# Patient Record
Sex: Male | Born: 1995 | Race: White | Hispanic: No | Marital: Single | Smoking: Never smoker
Health system: Southern US, Community
[De-identification: ages and names within clinical notes are randomized; demographics above are authoritative.]

## PROBLEM LIST (undated history)

## (undated) DIAGNOSIS — Z872 Personal history of diseases of the skin and subcutaneous tissue: Secondary | ICD-10-CM

## (undated) DIAGNOSIS — Z889 Allergy status to unspecified drugs, medicaments and biological substances status: Secondary | ICD-10-CM

## (undated) DIAGNOSIS — Z87898 Personal history of other specified conditions: Secondary | ICD-10-CM

## (undated) HISTORY — PX: EYE SURGERY: SHX253

## (undated) HISTORY — DX: Allergy status to unspecified drugs, medicaments and biological substances: Z88.9

## (undated) HISTORY — DX: Personal history of diseases of the skin and subcutaneous tissue: Z87.2

## (undated) HISTORY — DX: Personal history of other specified conditions: Z87.898

---
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
## 2017-09-07 ENCOUNTER — Emergency Department (HOSPITAL_COMMUNITY): Payer: Self-pay

## 2017-09-07 ENCOUNTER — Emergency Department (HOSPITAL_COMMUNITY)
Admission: EM | Admit: 2017-09-07 | Discharge: 2017-09-07 | Disposition: A | Discharge status: Home / Self Care | Payer: Self-pay | Attending: Emergency Medicine | Admitting: Emergency Medicine

## 2017-09-07 ENCOUNTER — Encounter (HOSPITAL_COMMUNITY): Payer: Self-pay | Admitting: Emergency Medicine

## 2017-09-07 DIAGNOSIS — R519 Headache, unspecified: Secondary | ICD-10-CM

## 2017-09-07 DIAGNOSIS — R51 Headache: Secondary | ICD-10-CM | POA: Insufficient documentation

## 2017-09-07 LAB — CBC WITH DIFFERENTIAL/PLATELET
Basophils Absolute: 0 10*3/uL (ref 0.0–0.1)
Basophils Relative: 0 %
Eosinophils Absolute: 0.2 10*3/uL (ref 0.0–0.7)
Eosinophils Relative: 2 %
HCT: 53.3 % — ABNORMAL HIGH (ref 39.0–52.0)
Hemoglobin: 19.2 g/dL — ABNORMAL HIGH (ref 13.0–17.0)
Lymphocytes Relative: 46 %
Lymphs Abs: 4.2 10*3/uL — ABNORMAL HIGH (ref 0.7–4.0)
MCH: 33 pg (ref 26.0–34.0)
MCHC: 36 g/dL (ref 30.0–36.0)
MCV: 91.7 fL (ref 78.0–100.0)
Monocytes Absolute: 0.7 10*3/uL (ref 0.1–1.0)
Monocytes Relative: 7 %
Neutro Abs: 4.2 10*3/uL (ref 1.7–7.7)
Neutrophils Relative %: 45 %
Platelets: 214 10*3/uL (ref 150–400)
RBC: 5.81 MIL/uL (ref 4.22–5.81)
RDW: 12.3 % (ref 11.5–15.5)
WBC: 9.4 10*3/uL (ref 4.0–10.5)

## 2017-09-07 LAB — BASIC METABOLIC PANEL
Anion gap: 7 (ref 5–15)
BUN: 10 mg/dL (ref 6–20)
CO2: 29 mmol/L (ref 22–32)
Calcium: 9.9 mg/dL (ref 8.9–10.3)
Chloride: 104 mmol/L (ref 101–111)
Creatinine, Ser: 0.89 mg/dL (ref 0.61–1.24)
GFR calc Af Amer: >60 mL/min (ref >60)
GFR calc non Af Amer: >60 mL/min (ref >60)
Glucose, Bld: 74 mg/dL (ref 65–99)
Potassium: 3.7 mmol/L (ref 3.5–5.1)
Sodium: 140 mmol/L (ref 135–145)

## 2017-09-07 MED ORDER — SODIUM CHLORIDE 0.9 % IV SOLN
25.0000 mg | Freq: Once | INTRAVENOUS | Status: AC
  Administered 2017-09-07: 25 mg via INTRAVENOUS
  Filled 2017-09-07: qty 0.5

## 2017-09-07 MED ORDER — KETOROLAC TROMETHAMINE 15 MG/ML IJ SOLN
15.0000 mg | Freq: Once | INTRAMUSCULAR | Status: AC
  Administered 2017-09-07: 15 mg via INTRAVENOUS
  Filled 2017-09-07: qty 1

## 2017-09-07 MED ORDER — IOPAMIDOL (ISOVUE-370) INJECTION 76%
INTRAVENOUS | Status: AC
  Administered 2017-09-07: 75 mL via INTRAVENOUS
  Filled 2017-09-07: qty 100

## 2017-09-07 MED ORDER — SODIUM CHLORIDE 0.9 % IV BOLUS (SEPSIS)
1000.0000 mL | Freq: Once | INTRAVENOUS | Status: AC
  Administered 2017-09-07: 1000 mL via INTRAVENOUS

## 2017-09-07 MED ORDER — IOPAMIDOL (ISOVUE-370) INJECTION 76%
75.0000 mL | Freq: Once | INTRAVENOUS | Status: AC | PRN
  Administered 2017-09-07 (×2): 75 mL via INTRAVENOUS

## 2017-09-07 MED ORDER — PROCHLORPERAZINE EDISYLATE 5 MG/ML IJ SOLN
10.0000 mg | Freq: Once | INTRAMUSCULAR | Status: AC
  Administered 2017-09-07: 10 mg via INTRAVENOUS
  Filled 2017-09-07: qty 2

## 2017-09-07 NOTE — ED Notes (Signed)
Pt. States he has took tylenol and his friends migraine medicine.

## 2017-09-07 NOTE — ED Triage Notes (Signed)
Patient c/o headache with N/V and light sensitivity x4 days. Reports minimal neck pain. Hx migraines. Reports he was sent by [facility] to r/o meningitis.

## 2017-09-07 NOTE — ED Notes (Signed)
MD at bedside. 

## 2017-09-10 NOTE — ED Provider Notes (Signed)
[facility]   CSN: [id number] Arrival date & time: 09/07/17  1159     History   Chief Complaint Chief Complaint  Patient presents with  . Headache    HPI Chris Browning is a 21 y.o. male.  HPI  21 year old male with headache. Acute onset of severe pain from partially 4 days ago. He still rates his pain as severe although actually somewhat improved from when initially started. Mild nausea. No vomiting. Photophobia. Headache is diffuse but worse in the occipital region. Denies any neck pain. No neck stiffness. No fevers. No acute numbness, tingling or focal loss of strength. No blood thinners. Denies trauma.  Past Medical History:  Diagnosis Date  . History of acne   . History of headache   . History of seasonal allergies     There are no active problems to display for this patient.   Past Surgical History:  Procedure Laterality Date  . EYE SURGERY     at 21 years old       Home Medications    Prior to Admission medications   Medication Sig Start Date End Date Taking? Authorizing Provider  acetaminophen (TYLENOL) 500 MG tablet Take 1,500 mg by mouth every 6 (six) hours as needed for headache.   Yes [provider]  acetaminophen-codeine (TYLENOL #3) 300-30 MG per tablet Take 1 tablet by mouth every 8 (eight) hours as needed for moderate pain or severe pain. Patient not taking: Reported on 09/07/2017 01/01/14   Lenell Antu, DO    Family History Family History  Problem Relation Age of Onset  . Alcoholism Unknown        both set of grandparents  . Breast cancer Paternal Grandmother   . Seizures Paternal Grandmother   . Diabetes Maternal Grandmother   . Cervical cancer Mother   . Hypertension Unknown        both sets of grandparents  . Heart disease Unknown        both sets of grandparents  . High Cholesterol Unknown        both sets of grandparents    Social History Social History  Substance Use  Topics  . Smoking status: Never Smoker  . Smokeless tobacco: Not on file  . Alcohol use No     Allergies   Patient has no known allergies.   Review of Systems Review of Systems  All systems reviewed and negative, other than as noted in HPI.  Physical Exam Updated Vital Signs BP 136/79 (BP Location: Left Arm)   Pulse 90   Temp 98.2 F (36.8 C) (Oral)   Resp 14   SpO2 99%   Physical Exam  Constitutional: He is oriented to person, place, and time. He appears well-developed and well-nourished. No distress.  HENT:  Head: Normocephalic and atraumatic.  Eyes: Conjunctivae are normal. Right eye exhibits no discharge. Left eye exhibits no discharge.  Neck: Neck supple.  Nuchal rigidity. Negative Kernig/Brudzinski signs.  Cardiovascular: Normal rate, regular rhythm and normal heart sounds.  Exam reveals no gallop and no friction rub.   No murmur heard. Pulmonary/Chest: Effort normal and breath sounds normal. No respiratory distress.  Abdominal: Soft. He exhibits no distension. There is no tenderness.  Musculoskeletal: He exhibits no edema or tenderness.  Neurological: He is alert and oriented to person, place, and time. No cranial nerve deficit or sensory deficit. Coordination normal.  Skin: Skin is warm and dry. He is not diaphoretic.  Psychiatric:  He has a normal mood and affect. His behavior is normal. Thought content normal.  Nursing note and vitals reviewed.    ED Treatments / Results  Labs (all labs ordered are listed, but only abnormal results are displayed) Labs Reviewed  CBC WITH DIFFERENTIAL/PLATELET - Abnormal; Notable for the following:       Result Value   Hemoglobin 19.2 (*)    HCT 53.3 (*)    Lymphs Abs 4.2 (*)    All other components within normal limits  BASIC METABOLIC PANEL    EKG  EKG Interpretation None       Radiology No results found.   Ct Angio Head W Or Wo Contrast  Result Date: 09/07/2017 CLINICAL DATA:  21 year old male with  headache, nausea vomiting, photophobia for 4 days. EXAM: CT ANGIOGRAPHY HEAD TECHNIQUE: Multidetector CT imaging of the head was performed using the standard protocol during bolus administration of intravenous contrast. Multiplanar CT image reconstructions and MIPs were obtained to evaluate the vascular anatomy. CONTRAST:  75 mL Isovue 378 COMPARISON:  None. FINDINGS: CT HEAD Brain: Cerebral volume is normal. No midline shift, ventriculomegaly, mass effect, evidence of mass lesion, intracranial hemorrhage or evidence of cortically based acute infarction. Gray-white matter differentiation is within normal limits throughout the brain. Calvarium and skull base: Negative. Paranasal sinuses: Clear. Orbits: Visualized orbits and scalp soft tissues are within normal limits. CTA HEAD Posterior circulation: Dominant distal left vertebral artery is patent without stenosis. Non dominant distal right vertebral artery is patent to the vertebrobasilar junction. Patent PICA origins. Patent basilar artery without stenosis. Normal SCA and PCA origins. Right posterior communicating artery is present while the left is diminutive or absent. Normal bilateral PCA branches. Anterior circulation: Normal visible cervical ICAs. The right ICA siphon appears dominant. No siphon atherosclerosis or stenosis identified. Normal ophthalmic and right posterior communicating artery origins. Patent carotid termini. The right ACA A1 segment is dominant and the left is diminutive or absent. The anterior communicating artery is ectatic with a median artery of the corpus callosum, but otherwise normal. The median artery appears dominant, while the bilateral A2 segments and branches are non dominant. Normal MCA origins. Early left MCA branching. Left MCA bifurcation and branches are within normal limits. Right MCA M1 segment, bifurcation, and right MCA branches are within normal limits. Venous sinuses: Patent. Anatomic variants: Dominant right ICA siphon,  diminutive or absent left ACA A1 segment, and dominant median artery of the corpus callosum. Dominant distal left vertebral artery. Delayed phase: No abnormal enhancement identified. Review of the MIP images confirms the above findings IMPRESSION: 1. Negative CTA Head; normal anatomic variations of the anterior cerebral artery anatomy. 2.  Normal CT appearance of the brain. Electronically Signed   By: Odessa FlemingH  Hall M.D.   On: 09/07/2017 16:06    Procedures Procedures (including critical care time)  Medications Ordered in ED Medications  sodium chloride 0.9 % bolus 1,000 mL (0 mLs Intravenous Stopped 09/07/17 1544)  prochlorperazine (COMPAZINE) injection 10 mg (10 mg Intravenous Given 09/07/17 1438)  diphenhydrAMINE (BENADRYL) 25 mg in sodium chloride 0.9 % 50 mL IVPB (0 mg Intravenous Stopped 09/07/17 1518)  iopamidol (ISOVUE-370) 76 % injection 75 mL (75 mLs Intravenous Contrast Given 09/07/17 1623)  ketorolac (TORADOL) 15 MG/ML injection 15 mg (15 mg Intravenous Given 09/07/17 1710)     Initial Impression / Assessment and Plan / ED Course  I have reviewed the triage vital signs and the nursing notes.  Pertinent labs & imaging results that were available  during my care of the patient were reviewed by me and considered in my medical decision making (see chart for details).     21 year old male with headache. Refer to ED for evaluation possible meningitis. Clinically, I highly doubt this. He is afebrile. Symptoms have been ongoing for 4 days and generally appears very well. He is afebrile. If anything, I'm somewhat concerned for possible subarachnoid hemorrhage given the very abrupt onset, different from prior headaches and certainly worse than prior headaches. He had a CTA which is unremarkable. Headache improved after medications. I doubt emergent process.   Final Clinical Impressions(s) / ED Diagnoses   Final diagnoses:  Acute nonintractable headache, unspecified headache type    New  Prescriptions Discharge Medication List as of 09/07/2017  4:24 PM       Raeford Razor, MD 09/10/17 1144

## 2018-11-30 IMAGING — CT CT ANGIO HEAD
1 of 11 series · 5 of 35 positions shown · IV contrast (ISOVUE 370)
Comparison: None.

CLINICAL DATA: 21-year-old male with headache, nausea vomiting,
photophobia for 4 days.

EXAM:
CT ANGIOGRAPHY HEAD
TECHNIQUE: Multidetector CT imaging of the head was performed using the
standard protocol during bolus administration of intravenous
contrast. Multiplanar CT image reconstructions and MIPs were
obtained to evaluate the vascular anatomy.
CONTRAST:  75 mL Isovue 378

[Series 10: axial thin · axial · 0.33mm/px · z∈[-38,+61]mm · 5 of 150 slices shown]
[im 25/150  soft-tissue]
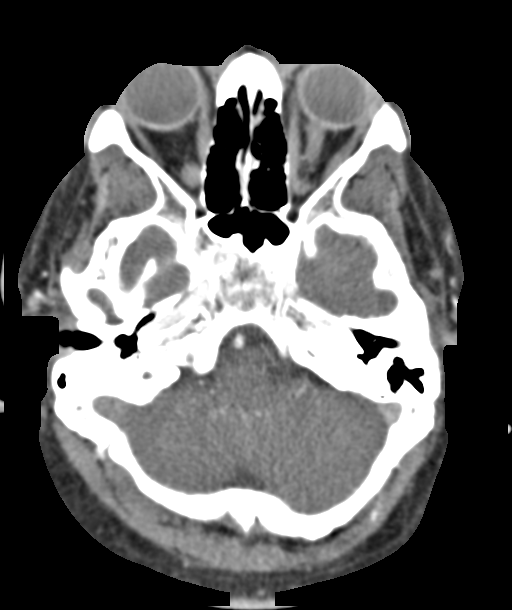
[im 50/150  bone]
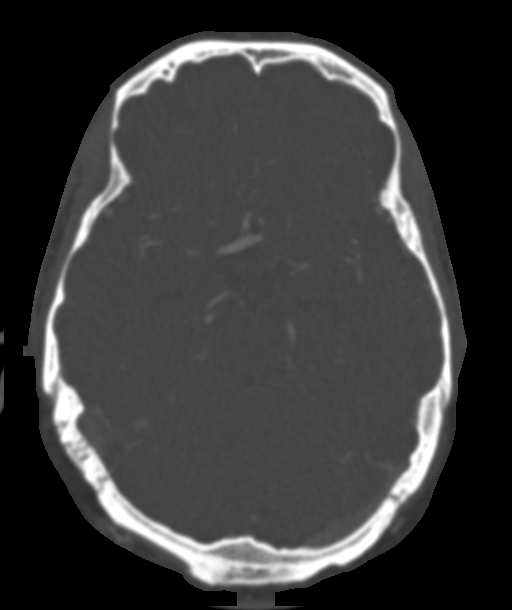
[im 75/150  soft-tissue]
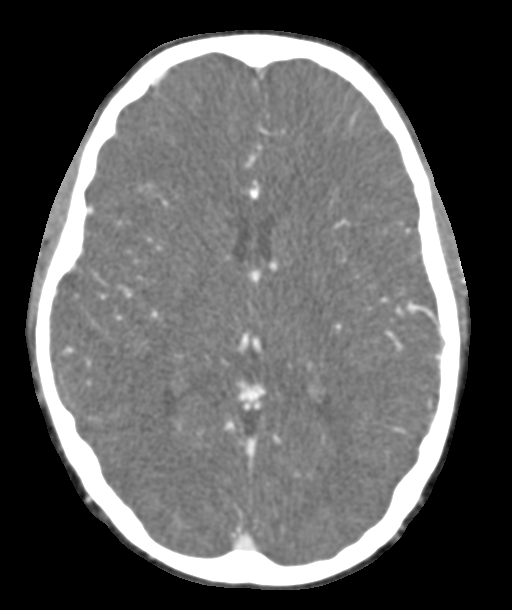
[im 100/150  bone]
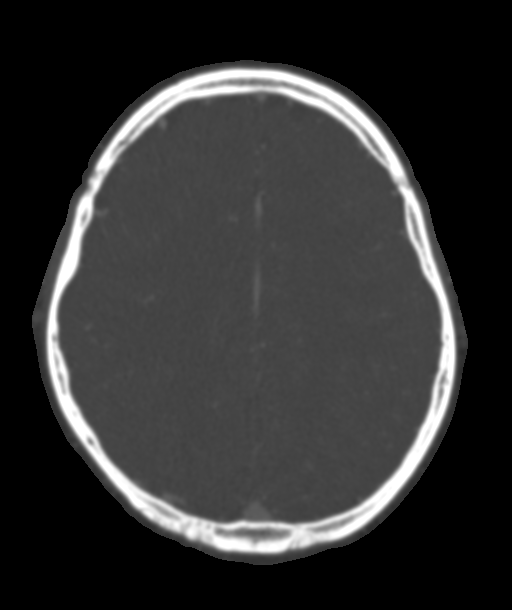
[im 125/150  soft-tissue]
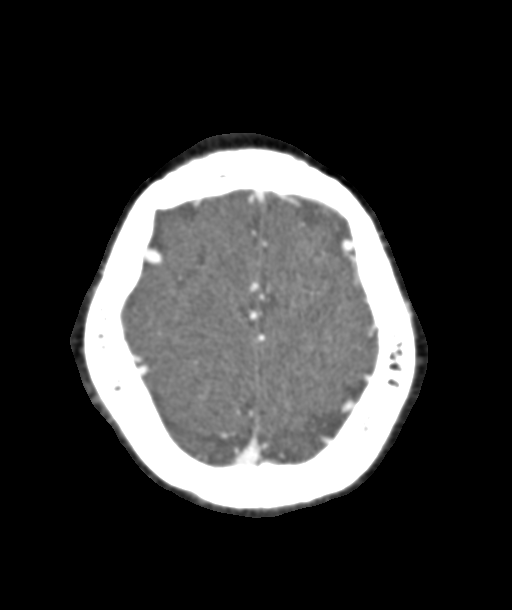

[5 of 35 positions shown; findings below may reference images not displayed]

FINDINGS: CT HEAD

Brain: Cerebral volume is normal. No midline shift,
ventriculomegaly, mass effect, evidence of mass lesion, intracranial
hemorrhage or evidence of cortically based acute infarction.
Gray-white matter differentiation is within normal limits throughout
the brain.

Calvarium and skull base: Negative.

Paranasal sinuses: Clear.

Orbits: Visualized orbits and scalp soft tissues are within normal
limits.

CTA HEAD

Posterior circulation: Dominant distal left vertebral artery is
patent without stenosis. Non dominant distal right vertebral artery
is patent to the vertebrobasilar junction. Patent PICA origins.
Patent basilar artery without stenosis. Normal SCA and PCA origins.
Right posterior communicating artery is present while the left is
diminutive or absent. Normal bilateral PCA branches.

Anterior circulation: Normal visible cervical ICAs. The right ICA
siphon appears dominant. No siphon atherosclerosis or stenosis
identified. Normal ophthalmic and right posterior communicating
artery origins. Patent carotid termini.

The right ACA A1 segment is dominant and the left is diminutive or
absent. The anterior communicating artery is ectatic with a median
artery of the corpus callosum, but otherwise normal. The median
artery appears dominant, while the bilateral A2 segments and
branches are non dominant.

Normal MCA origins. Early left MCA branching. Left MCA bifurcation
and branches are within normal limits. Right MCA M1 segment,
bifurcation, and right MCA branches are within normal limits.

Venous sinuses: Patent.

Anatomic variants: Dominant right ICA siphon, diminutive or absent
left ACA A1 segment, and dominant median artery of the corpus
callosum. Dominant distal left vertebral artery.

Delayed phase: No abnormal enhancement identified.

Review of the MIP images confirms the above findings
IMPRESSION: 1. Negative CTA Head; normal anatomic variations of the anterior
cerebral artery anatomy.
2.  Normal CT appearance of the brain.

## 2019-10-01 ENCOUNTER — Other Ambulatory Visit: Payer: Self-pay

## 2019-10-01 DIAGNOSIS — Z20822 Contact with and (suspected) exposure to covid-19: Secondary | ICD-10-CM

## 2019-10-02 LAB — NOVEL CORONAVIRUS, NAA: SARS-CoV-2, NAA: DETECTED — AB

## 2019-10-08 ENCOUNTER — Other Ambulatory Visit: Payer: Self-pay

## 2019-10-08 DIAGNOSIS — Z20822 Contact with and (suspected) exposure to covid-19: Secondary | ICD-10-CM

## 2019-10-11 LAB — NOVEL CORONAVIRUS, NAA: SARS-CoV-2, NAA: DETECTED — AB

## 2019-10-12 ENCOUNTER — Ambulatory Visit: Payer: Self-pay

## 2019-10-12 NOTE — Telephone Encounter (Signed)
Provided covid lab results to Patient voiced understanding.  Provided care advice.

## 2019-10-18 ENCOUNTER — Other Ambulatory Visit: Payer: Self-pay

## 2019-10-18 DIAGNOSIS — Z20822 Contact with and (suspected) exposure to covid-19: Secondary | ICD-10-CM

## 2019-10-20 LAB — NOVEL CORONAVIRUS, NAA: SARS-CoV-2, NAA: NOT DETECTED

## 2020-11-22 ENCOUNTER — Emergency Department (HOSPITAL_COMMUNITY): Payer: Self-pay

## 2020-11-22 ENCOUNTER — Emergency Department (HOSPITAL_COMMUNITY)
Admission: EM | Admit: 2020-11-22 | Discharge: 2020-11-23 | Disposition: A | Discharge status: Home / Self Care | Payer: Self-pay | Attending: Emergency Medicine | Admitting: Emergency Medicine

## 2020-11-22 ENCOUNTER — Encounter (HOSPITAL_COMMUNITY): Payer: Self-pay | Admitting: Emergency Medicine

## 2020-11-22 ENCOUNTER — Other Ambulatory Visit: Payer: Self-pay

## 2020-11-22 DIAGNOSIS — R509 Fever, unspecified: Secondary | ICD-10-CM | POA: Insufficient documentation

## 2020-11-22 DIAGNOSIS — R519 Headache, unspecified: Secondary | ICD-10-CM | POA: Insufficient documentation

## 2020-11-22 DIAGNOSIS — Z5321 Procedure and treatment not carried out due to patient leaving prior to being seen by health care provider: Secondary | ICD-10-CM | POA: Insufficient documentation

## 2020-11-22 DIAGNOSIS — Z20822 Contact with and (suspected) exposure to covid-19: Secondary | ICD-10-CM | POA: Insufficient documentation

## 2020-11-22 DIAGNOSIS — J029 Acute pharyngitis, unspecified: Secondary | ICD-10-CM | POA: Insufficient documentation

## 2020-11-22 DIAGNOSIS — R059 Cough, unspecified: Secondary | ICD-10-CM | POA: Insufficient documentation

## 2020-11-22 LAB — RESP PANEL BY RT-PCR (FLU A&B, COVID) ARPGX2
Influenza A by PCR: NEGATIVE
Influenza B by PCR: NEGATIVE
SARS Coronavirus 2 by RT PCR: NEGATIVE

## 2020-11-22 NOTE — ED Notes (Signed)
Pt states he will try to get seen faster at another hospital. Pt will return here if no better.

## 2020-11-22 NOTE — ED Triage Notes (Addendum)
Patient reports cough , fever, headache and sore throat this week , currently taking oral antibiotic prescribed for his tonsillitis . Unvaccinated against [id number].

## 2020-11-23 ENCOUNTER — Encounter (HOSPITAL_COMMUNITY): Payer: Self-pay | Admitting: Emergency Medicine

## 2020-11-23 ENCOUNTER — Emergency Department (HOSPITAL_COMMUNITY): Payer: PRIVATE HEALTH INSURANCE

## 2020-11-23 ENCOUNTER — Emergency Department (HOSPITAL_COMMUNITY)
Admission: EM | Admit: 2020-11-23 | Discharge: 2020-11-23 | Disposition: A | Discharge status: Home / Self Care | Payer: PRIVATE HEALTH INSURANCE | Attending: Emergency Medicine | Admitting: Emergency Medicine

## 2020-11-23 DIAGNOSIS — H40212 Acute angle-closure glaucoma, left eye: Secondary | ICD-10-CM | POA: Insufficient documentation

## 2020-11-23 DIAGNOSIS — R519 Headache, unspecified: Secondary | ICD-10-CM | POA: Diagnosis present

## 2020-11-23 LAB — URINALYSIS, ROUTINE W REFLEX MICROSCOPIC
Bilirubin Urine: NEGATIVE
Glucose, UA: NEGATIVE mg/dL
Hgb urine dipstick: NEGATIVE
Ketones, ur: NEGATIVE mg/dL
Leukocytes,Ua: NEGATIVE
Nitrite: NEGATIVE
Protein, ur: NEGATIVE mg/dL
Specific Gravity, Urine: 1.019 (ref 1.005–1.030)
pH: 5 (ref 5.0–8.0)

## 2020-11-23 LAB — CBC
HCT: 48.6 % (ref 39.0–52.0)
Hemoglobin: 17.9 g/dL — ABNORMAL HIGH (ref 13.0–17.0)
MCH: 33 pg (ref 26.0–34.0)
MCHC: 36.8 g/dL — ABNORMAL HIGH (ref 30.0–36.0)
MCV: 89.7 fL (ref 80.0–100.0)
Platelets: 191 10*3/uL (ref 150–400)
RBC: 5.42 MIL/uL (ref 4.22–5.81)
RDW: 11.5 % (ref 11.5–15.5)
WBC: 6 10*3/uL (ref 4.0–10.5)
nRBC: 0 % (ref 0.0–0.2)

## 2020-11-23 LAB — BASIC METABOLIC PANEL
Anion gap: 13 (ref 5–15)
BUN: 7 mg/dL (ref 6–20)
CO2: 25 mmol/L (ref 22–32)
Calcium: 9.5 mg/dL (ref 8.9–10.3)
Chloride: 100 mmol/L (ref 98–111)
Creatinine, Ser: 0.72 mg/dL (ref 0.61–1.24)
GFR, Estimated: >60 mL/min (ref >60)
Glucose, Bld: 107 mg/dL — ABNORMAL HIGH (ref 70–99)
Potassium: 3.4 mmol/L — ABNORMAL LOW (ref 3.5–5.1)
Sodium: 138 mmol/L (ref 135–145)

## 2020-11-23 MED ORDER — TIMOLOL MALEATE 0.5 % OP SOLN
1.0000 [drp] | Freq: Once | OPHTHALMIC | Status: AC
  Administered 2020-11-23: 22:00:00 1 [drp] via OPHTHALMIC
  Filled 2020-11-23: qty 5

## 2020-11-23 MED ORDER — FLUORESCEIN SODIUM 1 MG OP STRP
1.0000 | ORAL_STRIP | Freq: Once | OPHTHALMIC | Status: AC
  Administered 2020-11-23: 22:00:00 1 via OPHTHALMIC
  Filled 2020-11-23: qty 1

## 2020-11-23 MED ORDER — APRACLONIDINE HCL 1 % OP SOLN
1.0000 [drp] | Freq: Once | OPHTHALMIC | Status: AC
  Administered 2020-11-23: 22:00:00 1 [drp] via OPHTHALMIC
  Filled 2020-11-23: qty 2.4

## 2020-11-23 MED ORDER — TETRACAINE HCL 0.5 % OP SOLN
2.0000 [drp] | Freq: Once | OPHTHALMIC | Status: AC
  Administered 2020-11-23: 22:00:00 2 [drp] via OPHTHALMIC
  Filled 2020-11-23: qty 4

## 2020-11-23 MED ORDER — PILOCARPINE HCL 2 % OP SOLN
1.0000 [drp] | Freq: Once | OPHTHALMIC | Status: AC
  Administered 2020-11-23: 22:00:00 1 [drp] via OPHTHALMIC
  Filled 2020-11-23: qty 15

## 2020-11-23 MED ORDER — IBUPROFEN 400 MG PO TABS
600.0000 mg | ORAL_TABLET | Freq: Once | ORAL | Status: AC
  Administered 2020-11-23: 21:00:00 600 mg via ORAL
  Filled 2020-11-23: qty 1

## 2020-11-23 MED ORDER — ACETAZOLAMIDE 250 MG PO TABS
500.0000 mg | ORAL_TABLET | Freq: Once | ORAL | Status: AC
  Administered 2020-11-23: 22:00:00 500 mg via ORAL
  Filled 2020-11-23: qty 2

## 2020-11-23 NOTE — ED Provider Notes (Signed)
[facility] EMERGENCY DEPARTMENT Provider Note   CSN: [id number] Arrival date & time: 11/23/20  2119     History Chief Complaint  Patient presents with  . Headache    Chris Browning is a 24 y.o. male.  Patient presents to the ER chief complaint of headache and left eye irritation.  Symptoms ongoing since yesterday waxing and waning.  Patient states that time he arrived in the ER the headache is resolved and gone.  Continues to complain of irritation and pain to the left eye describes it as a sandy type feeling.  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  He is also recently had a bout of tonsillitis and is finishing up antibiotics.  He had a fever 2 days ago but no fever yesterday and today.        Past Medical History:  Diagnosis Date  . History of acne   . History of headache   . History of seasonal allergies     There are no problems to display for this patient.   Past Surgical History:  Procedure Laterality Date  . EYE SURGERY     at 24 years old       Family History  Problem Relation Age of Onset  . Alcoholism Other        both set of grandparents  . Breast cancer Paternal Grandmother   . Seizures Paternal Grandmother   . Diabetes Maternal Grandmother   . Cervical cancer Mother   . Hypertension Other        both sets of grandparents  . Heart disease Other        both sets of grandparents  . High Cholesterol Other        both sets of grandparents    Social History   Tobacco Use  . Smoking status: Never Smoker  . Smokeless tobacco: Never Used  Substance Use Topics  . Alcohol use: No  . Drug use: No    Home Medications Prior to Admission medications   Medication Sig Start Date End Date Taking? Authorizing Provider  acetaminophen (TYLENOL) 500 MG tablet Take 1,500 mg by mouth every 6 (six) hours as needed for  headache.    [provider]  acetaminophen-codeine (TYLENOL #3) 300-30 MG per tablet Take 1 tablet by mouth every 8 (eight) hours as needed for moderate pain or severe pain. Patient not taking: Reported on 09/07/2017 01/01/14   Hamilton Capri P, DO    Allergies    Patient has no known allergies.  Review of Systems   Review of Systems  Constitutional: Negative for fever.  HENT: Negative for ear pain and sore throat.   Eyes: Positive for redness.  Respiratory: Negative for cough.   Cardiovascular: Negative for chest pain.  Gastrointestinal: Negative for abdominal pain.  Genitourinary: Negative for flank pain.  Musculoskeletal: Negative for back pain.  Skin: Negative for color change and rash.  Neurological: Negative for syncope.  All other systems reviewed and are negative.   Physical Exam Updated Vital Signs BP 115/77   Pulse 69   Temp 98.6 F (37 C) (Oral)   Resp 16   Ht 5\' 6"  (1.676 m)   Wt 100.7 kg   SpO2 97%   BMI 35.83 kg/m   Physical Exam Constitutional:      General: He is  not in acute distress.    Appearance: He is well-developed.  HENT:     Head: Normocephalic.     Nose: Nose normal.  Eyes:     Extraocular Movements: Extraocular movements intact.     Comments: Positive conjunctival junction in the left eye.  Pupils equal reactive bilaterally.  Cardiovascular:     Rate and Rhythm: Normal rate.  Pulmonary:     Effort: Pulmonary effort is normal.  Skin:    Coloration: Skin is not jaundiced.  Neurological:     Mental Status: He is alert. Mental status is at baseline.     ED Results / Procedures / Treatments   Labs (all labs ordered are listed, but only abnormal results are displayed) Labs Reviewed  BASIC METABOLIC PANEL - Abnormal; Notable for the following components:      Result Value   Potassium 3.4 (*)    Glucose, Bld 107 (*)    All other components within normal limits  CBC - Abnormal; Notable for the following components:   Hemoglobin  17.9 (*)    MCHC 36.8 (*)    All other components within normal limits  URINALYSIS, ROUTINE W REFLEX MICROSCOPIC  CBG MONITORING, ED    EKG None  Radiology CT HEAD WO CONTRAST  Result Date: 11/23/2020 CLINICAL DATA:  Right eye pain, redness, swelling and visual loss with photosensitivity. Fever for the past 6 days. EXAM: CT HEAD WITHOUT CONTRAST TECHNIQUE: Contiguous axial images were obtained from the base of the skull through the vertex without intravenous contrast. COMPARISON:  09/07/2017 FINDINGS: Brain: Normal appearing cerebral hemispheres and posterior fossa structures. Normal size and position of the ventricles. No intracranial hemorrhage, mass lesion or CT evidence of acute infarction. Vascular: No hyperdense vessel or unexpected calcification. Skull: Normal. Negative for fracture or focal lesion. Sinuses/Orbits: No acute finding. Other: None. IMPRESSION: Normal examination. Electronically Signed   By: Beckie Salts M.D.   On: 11/23/2020 10:19   DG Chest Portable 1 View  Result Date: 11/22/2020 CLINICAL DATA:  Fever chills EXAM: PORTABLE CHEST 1 VIEW COMPARISON:  None. FINDINGS: The heart size and mediastinal contours are within normal limits. Both lungs are clear. The visualized skeletal structures are unremarkable. IMPRESSION: No active disease. Electronically Signed   By: Jonna Clark M.D.   On: 11/22/2020 21:44    Procedures .Critical Care Performed by: Cheryll Cockayne, MD Authorized by: Cheryll Cockayne, MD   Critical care provider statement:    Critical care time (minutes):  45   Critical care was time spent personally by me on the following activities:  Discussions with consultants, evaluation of patient's response to treatment, examination of patient, ordering and performing treatments and interventions, ordering and review of laboratory studies, ordering and review of radiographic studies, pulse oximetry, re-evaluation of patient's condition, obtaining history from patient  or surrogate and review of old charts Comments:     Acute angle-closure glaucoma   (including critical care time)  Medications Ordered in ED Medications  tetracaine (PONTOCAINE) 0.5 % ophthalmic solution 2 drop (2 drops Left Eye Given 11/23/20 2201)  fluorescein ophthalmic strip 1 strip (1 strip Left Eye Given 11/23/20 2201)  ibuprofen (ADVIL) tablet 600 mg (600 mg Oral Given 11/23/20 2102)  timolol (TIMOPTIC) 0.5 % ophthalmic solution 1 drop (1 drop Left Eye Given 11/23/20 2215)  apraclonidine (IOPIDINE) 1 % ophthalmic solution 1 drop (1 drop Left Eye Given 11/23/20 2217)  pilocarpine (PILOCAR) 2 % ophthalmic solution 1 drop (1 drop Left Eye Given 11/23/20  2220)  acetaZOLAMIDE (DIAMOX) tablet 500 mg (500 mg Oral Given 11/23/20 2216)    ED Course  I have reviewed the triage vital signs and the nursing notes.  Pertinent labs & imaging results that were available during my care of the patient were reviewed by me and considered in my medical decision making (see chart for details).    MDM Rules/Calculators/A&P                          Initial Tono-Pen continue to give error messages.  New Tono-Pen was used.  Given this Tono-Pen in the right eye it appears to have pressures of 2025 and 27.  The left affected eye has pressures of 45,47 and 47.  Given the elevated pressures, patient given timolol drops at per clonidine drops and pilocarpine drops as well as acetazolamide orally.  Case discussed with ophthalmologist Dr.Brevis, tonight in his office at 11:15 PM. Final Clinical Impression(s) / ED Diagnoses Final diagnoses:  Acute angle-closure glaucoma of left eye    Rx / DC Orders ED Discharge Orders    None       Cheryll Cockayne, MD 11/23/20 2236

## 2020-11-23 NOTE — ED Notes (Signed)
Pt not answering for vital recheck  

## 2020-11-23 NOTE — Discharge Instructions (Addendum)
Go directly to the ophthalmologist office tonight at 11:15 PM.    Return to the ER if you have any additional complications worsening pain fevers or have any additional questions.

## 2020-11-23 NOTE — ED Triage Notes (Addendum)
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 Recently treated for tonsilitis. bp 196/128. Did use some nasal spray this morning per advice of telehealth provider. Pt was here last night but LWBS. Family hx of aneurism, no hx of HTN. A/ox4. Neuro intact. speech clear, face symmetrical, Endorses nausea, 4mg  zofran given pta. Negative covid test last night. Also reports feeling off balance.

## 2020-11-23 NOTE — ED Notes (Signed)
Discharge instructions provided to patient. Verbalized understanding. Alert and oriented. IV lock removed. Ambulated with steady gait out of ED with family. 

## 2022-02-14 IMAGING — DX DG CHEST 1V PORT
1 series · 1 of 1 positions shown · non-contrast
Comparison: None.

CLINICAL DATA: Fever chills

EXAM:
PORTABLE CHEST 1 VIEW

[chest ap]
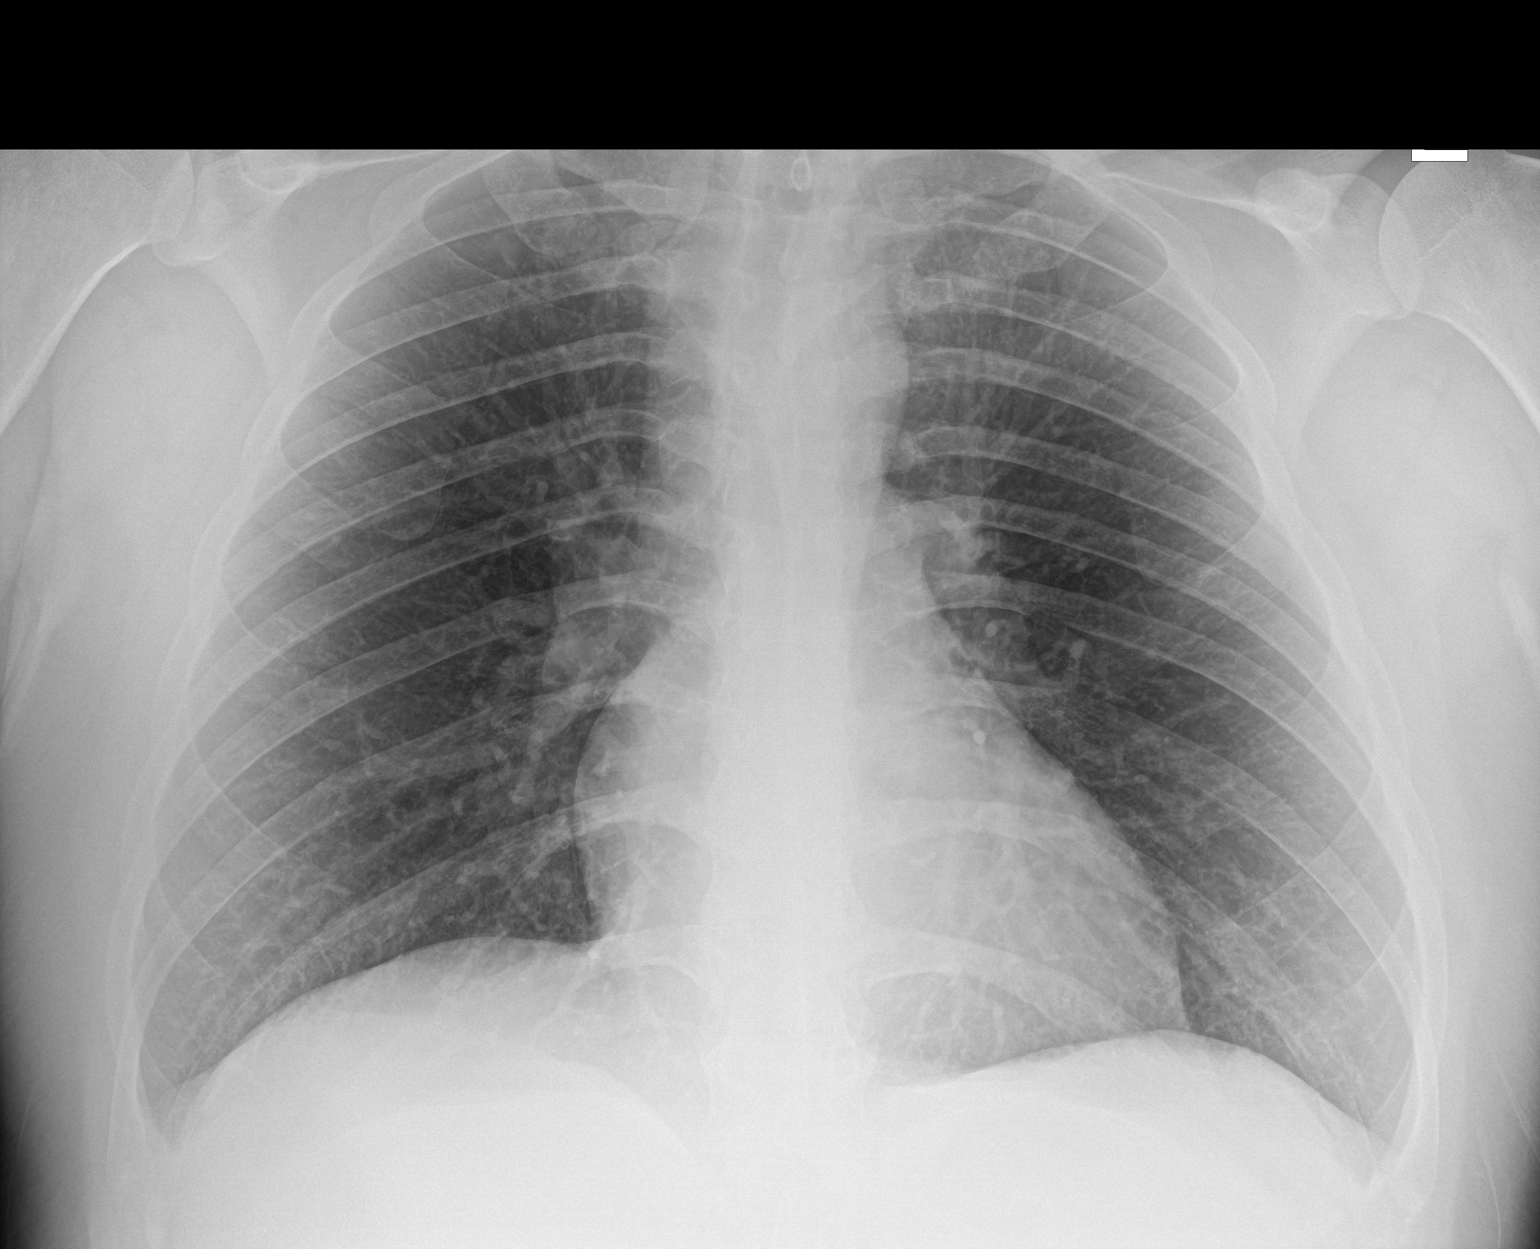

[1 of 1 positions shown; findings below may reference images not displayed]

FINDINGS: The heart size and mediastinal contours are within normal limits.
Both lungs are clear. The visualized skeletal structures are
unremarkable.
IMPRESSION: No active disease.
